# Patient Record
Sex: Male | Born: 1965 | Race: Black or African American | Hispanic: No | Marital: Married | Smoking: Never smoker
Health system: Southern US, Community
[De-identification: ages and names within clinical notes are randomized; demographics above are authoritative.]

## PROBLEM LIST (undated history)

## (undated) ENCOUNTER — Ambulatory Visit: Payer: Self-pay

---

## 1998-06-17 ENCOUNTER — Ambulatory Visit (HOSPITAL_COMMUNITY): Admission: RE | Admit: 1998-06-17 | Discharge: 1998-06-17 | Payer: Self-pay | Admitting: Gastroenterology

## 1998-11-19 ENCOUNTER — Emergency Department (HOSPITAL_COMMUNITY): Admission: EM | Admit: 1998-11-19 | Discharge: 1998-11-19 | Payer: Self-pay | Admitting: Emergency Medicine

## 2001-10-14 ENCOUNTER — Encounter: Payer: Self-pay | Admitting: Emergency Medicine

## 2001-10-14 ENCOUNTER — Emergency Department (HOSPITAL_COMMUNITY): Admission: EM | Admit: 2001-10-14 | Discharge: 2001-10-14 | Payer: Self-pay | Admitting: Emergency Medicine

## 2001-10-17 ENCOUNTER — Ambulatory Visit (HOSPITAL_COMMUNITY): Admission: RE | Admit: 2001-10-17 | Discharge: 2001-10-17 | Payer: Self-pay | Admitting: *Deleted

## 2010-01-27 ENCOUNTER — Emergency Department (HOSPITAL_COMMUNITY): Admission: EM | Admit: 2010-01-27 | Discharge: 2010-01-27 | Payer: Self-pay | Admitting: Emergency Medicine

## 2010-08-09 IMAGING — CT CT ANGIO NECK
2 of 10 series · 10 of 46 positions shown, 15 images · IV contrast (APPLIED)
Comparison: None.

CLINICAL DATA: Neck pain on the left.  Evaluate for possible
carotid dissection.

CT ANGIOGRAPHY NECK
TECHNIQUE: Multidetector CT imaging of the neck was performed
using the standard protocol during bolus administration of
intravenous contrast.  Multiplanar CT image reconstructions
including MIPs were obtained to evaluate the vascular anatomy.
Carotid stenosis measurements (when applicable) are obtained
utilizing NASCET criteria, using the distal internal carotid
diameter as the denominator.
Contrast:  Omnipaque 350, 100 ml

[Series 4: neck w/o 3.0 b40s · axial · non-contrast · 0.39mm/px · z∈[-210,-48]mm · 4 of 90 slices shown, 9 images]
[im 18/90  soft-tissue]
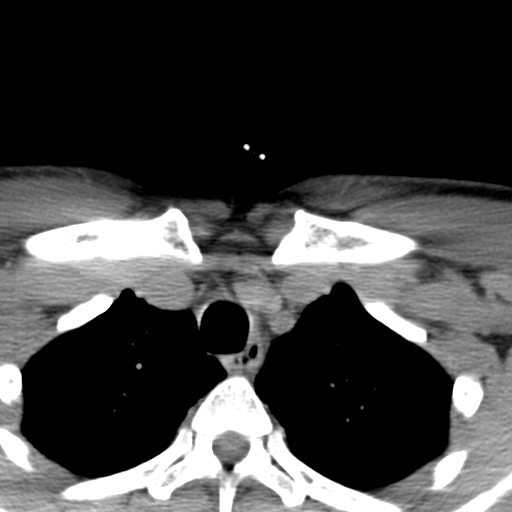
[im 18/90  lung]
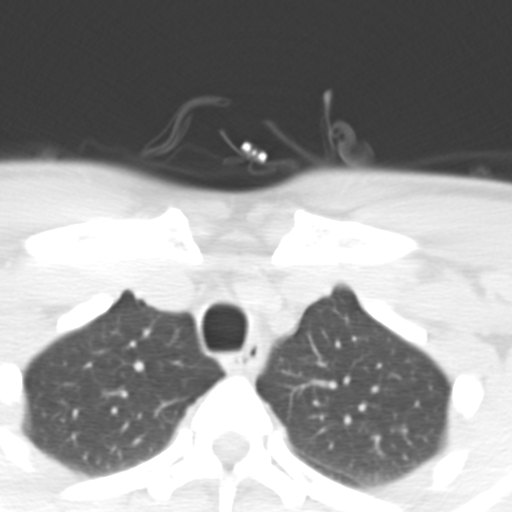
[im 18/90  bone]
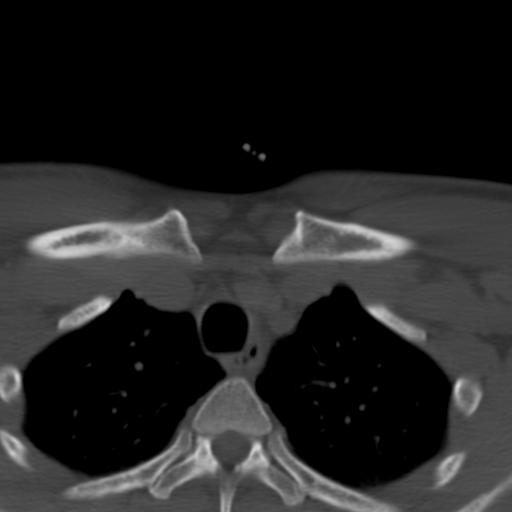
[im 36/90  soft-tissue]
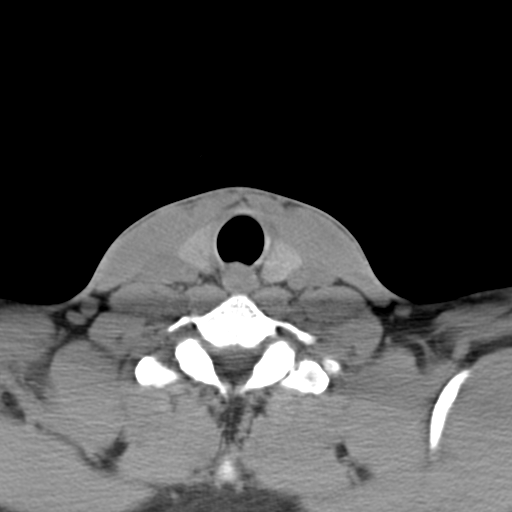
[im 36/90  lung]
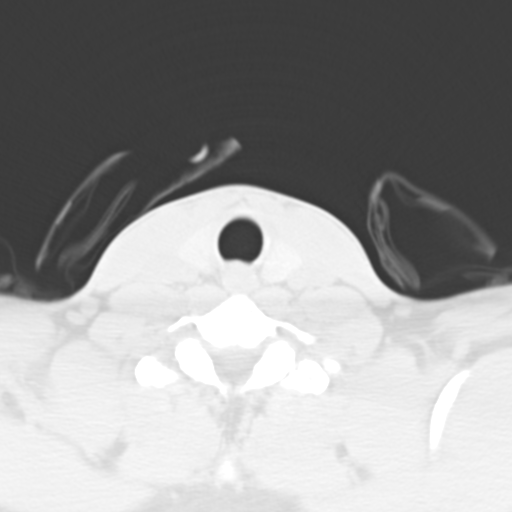
[im 54/90  soft-tissue]
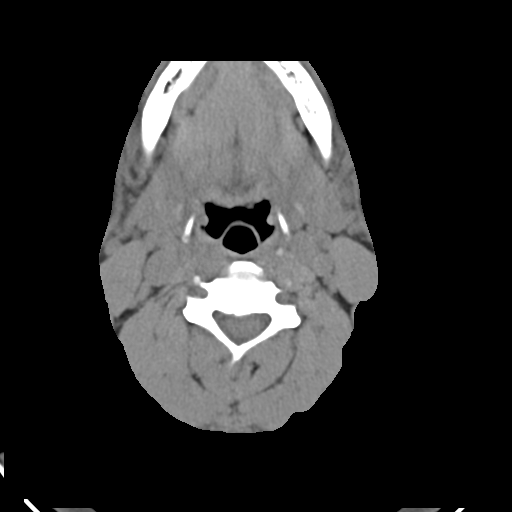
[im 54/90  lung]
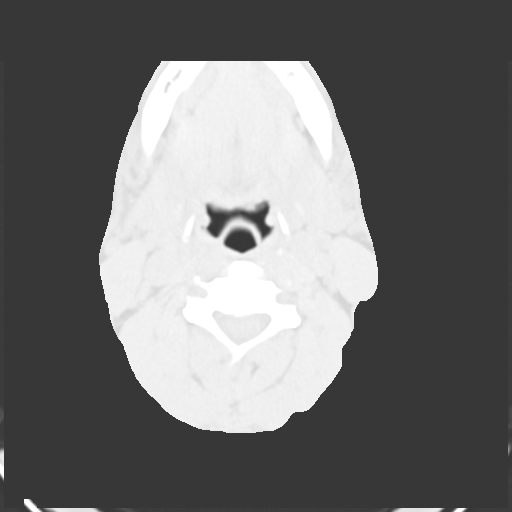
[im 72/90  soft-tissue]
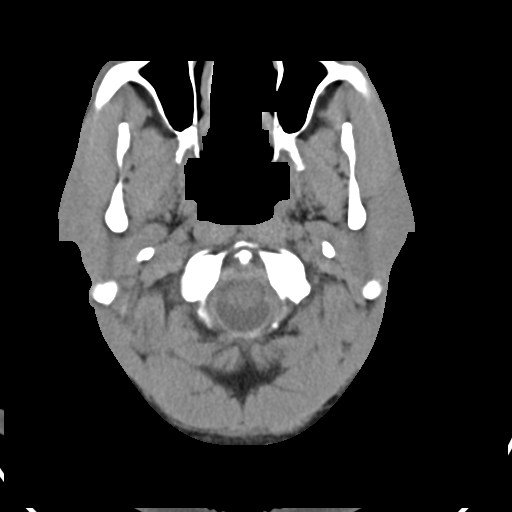
[im 72/90  lung]
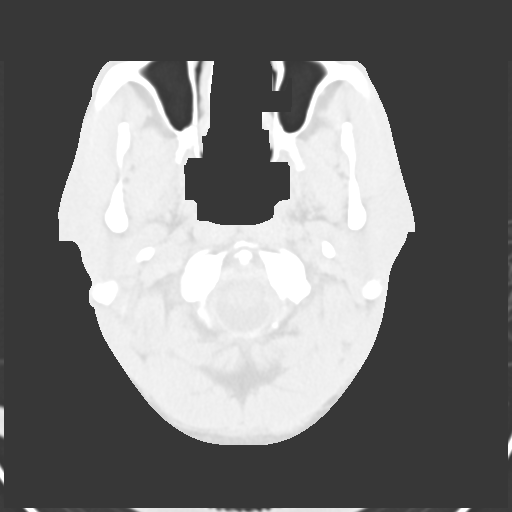

[Series 7: (person_name) 2.0 b30f · axial · 0.39mm/px · z∈[-225,-39]mm · 6 of 131 slices shown]
[im 19/131  soft-tissue]
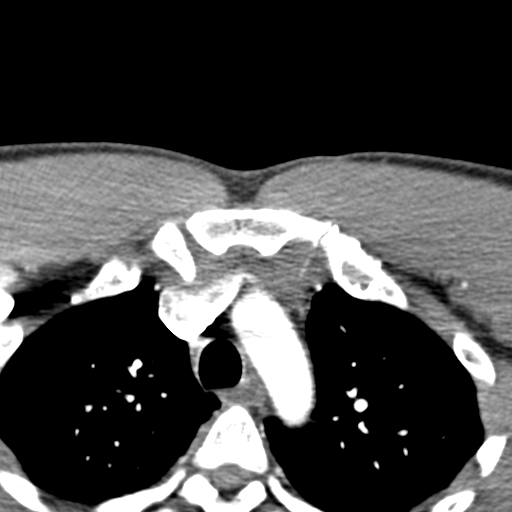
[im 38/131  soft-tissue]
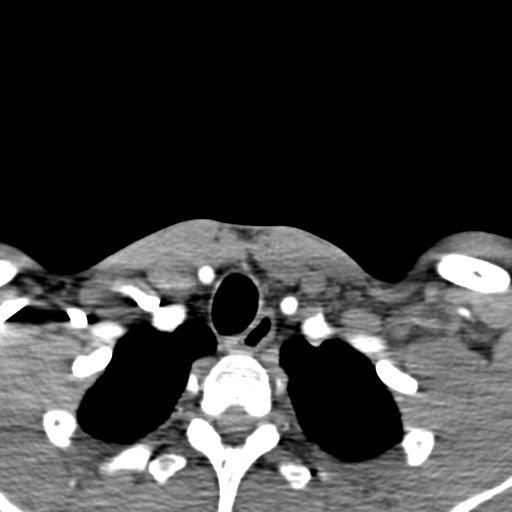
[im 56/131  soft-tissue]
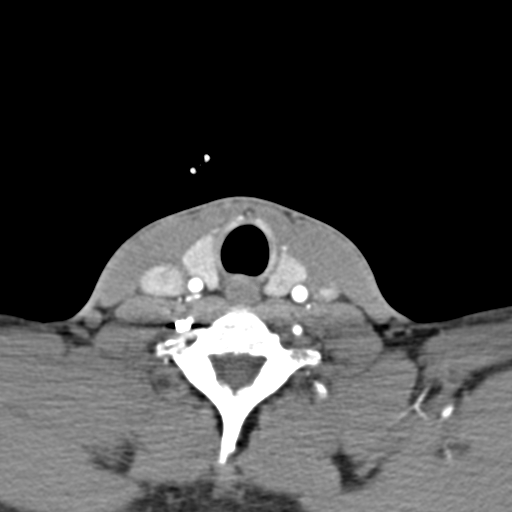
[im 75/131  soft-tissue]
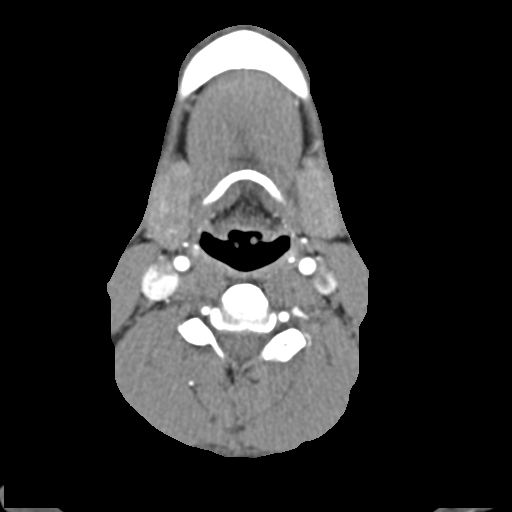
[im 93/131  soft-tissue]
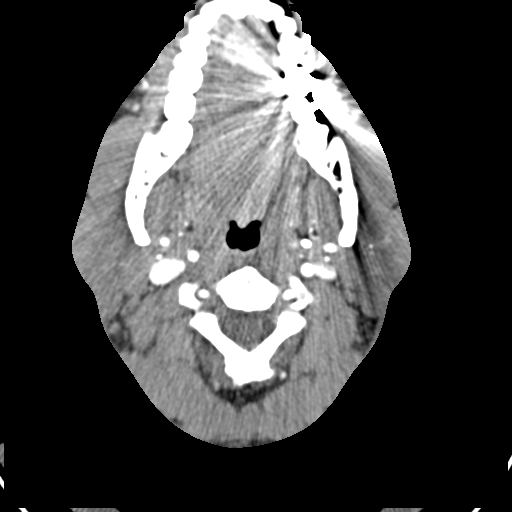
[im 112/131  soft-tissue]
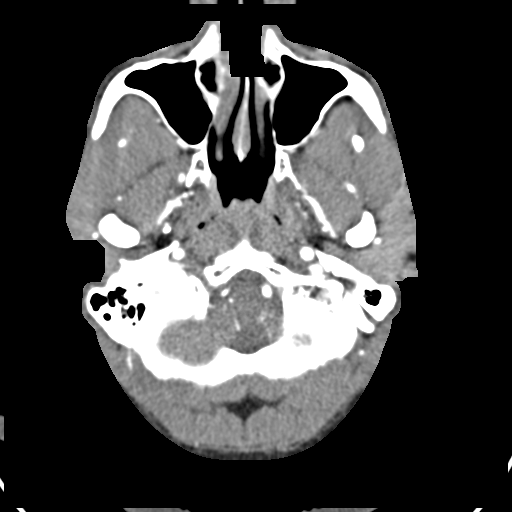

[10 of 46 positions shown; findings below may reference images not displayed]

FINDINGS: There is good opacification of the craniocerebral
vasculature.  Conventional branching of the great vessels from the
arch is seen.  There is no proximal stenosis.

Carotid bifurcations are free of disease.  There is no evidence for
carotid dissection or fibromuscular dysplasia.  Left greater than
right vertebral arteries are widely patent and contribute to the
basilar.

Mild cervical spondylosis is present.  No neck masses are seen.
Hypertrophied styloid process on the left incidentally noted.
Airway is clear.  Lung apices show no abnormality.

 Review of the MIP images confirms the above findings.
IMPRESSION: No evidence for carotid dissection or other acute vascular
abnormality.  Normal carotid bifurcation and cervical internal
carotid arteries.

## 2013-12-15 ENCOUNTER — Encounter (HOSPITAL_COMMUNITY): Payer: Self-pay | Admitting: Emergency Medicine

## 2013-12-15 ENCOUNTER — Emergency Department (HOSPITAL_COMMUNITY)
Admission: EM | Admit: 2013-12-15 | Discharge: 2013-12-15 | Disposition: A | Discharge status: Home / Self Care | Payer: BC Managed Care – PPO | Attending: Emergency Medicine | Admitting: Emergency Medicine

## 2013-12-15 DIAGNOSIS — R6883 Chills (without fever): Secondary | ICD-10-CM | POA: Insufficient documentation

## 2013-12-15 DIAGNOSIS — R197 Diarrhea, unspecified: Secondary | ICD-10-CM | POA: Insufficient documentation

## 2013-12-15 DIAGNOSIS — R112 Nausea with vomiting, unspecified: Secondary | ICD-10-CM | POA: Insufficient documentation

## 2013-12-15 DIAGNOSIS — R109 Unspecified abdominal pain: Secondary | ICD-10-CM | POA: Insufficient documentation

## 2013-12-15 DIAGNOSIS — R1110 Vomiting, unspecified: Secondary | ICD-10-CM

## 2013-12-15 LAB — COMPREHENSIVE METABOLIC PANEL
ALT: 27 U/L (ref 0–53)
AST: 18 U/L (ref 0–37)
Alkaline Phosphatase: 69 U/L (ref 39–117)
CO2: 26 mEq/L (ref 19–32)
Chloride: 101 mEq/L (ref 96–112)
Creatinine, Ser: 1.24 mg/dL (ref 0.50–1.35)
GFR calc non Af Amer: 68 mL/min — ABNORMAL LOW (ref >90)
Total Bilirubin: 0.6 mg/dL (ref 0.3–1.2)
Total Protein: 8 g/dL (ref 6.0–8.3)

## 2013-12-15 LAB — CBC WITH DIFFERENTIAL/PLATELET
Basophils Absolute: 0 10*3/uL (ref 0.0–0.1)
Basophils Relative: 0 % (ref 0–1)
Eosinophils Absolute: 0 10*3/uL (ref 0.0–0.7)
Eosinophils Relative: 0 % (ref 0–5)
HCT: 42.4 % (ref 39.0–52.0)
Hemoglobin: 15.3 g/dL (ref 13.0–17.0)
Lymphocytes Relative: 14 % (ref 12–46)
Lymphs Abs: 1.5 10*3/uL (ref 0.7–4.0)
MCH: 31.2 pg (ref 26.0–34.0)
MCHC: 36.1 g/dL — ABNORMAL HIGH (ref 30.0–36.0)
MCV: 86.5 fL (ref 78.0–100.0)
Monocytes Absolute: 0.7 10*3/uL (ref 0.1–1.0)
Monocytes Relative: 7 % (ref 3–12)
Neutro Abs: 8.2 10*3/uL — ABNORMAL HIGH (ref 1.7–7.7)
Neutrophils Relative %: 78 % — ABNORMAL HIGH (ref 43–77)
Platelets: 169 10*3/uL (ref 150–400)
RBC: 4.9 MIL/uL (ref 4.22–5.81)
RDW: 13.4 % (ref 11.5–15.5)
WBC: 10.4 10*3/uL (ref 4.0–10.5)

## 2013-12-15 MED ORDER — SODIUM CHLORIDE 0.9 % IV BOLUS (SEPSIS)
1000.0000 mL | Freq: Once | INTRAVENOUS | Status: AC
  Administered 2013-12-15: 1000 mL via INTRAVENOUS

## 2013-12-15 MED ORDER — PROMETHAZINE HCL 25 MG/ML IJ SOLN
12.5000 mg | Freq: Once | INTRAMUSCULAR | Status: AC
  Administered 2013-12-15: 12.5 mg via INTRAVENOUS
  Filled 2013-12-15: qty 1

## 2013-12-15 MED ORDER — ONDANSETRON HCL 4 MG/2ML IJ SOLN
4.0000 mg | Freq: Once | INTRAMUSCULAR | Status: AC
  Administered 2013-12-15: 4 mg via INTRAVENOUS
  Filled 2013-12-15: qty 2

## 2013-12-15 MED ORDER — ONDANSETRON HCL 4 MG PO TABS: 4.0000 mg | ORAL_TABLET | Freq: Four times a day (QID) | ORAL | Status: AC

## 2013-12-15 NOTE — ED Notes (Signed)
Pt states last night he started getting sick  Pt states last night he had diarrhea multiple times  This morning he woke up nauseated and has had vomiting throughout the day  Pt states he has drank ginger ale and sprite and is unable to keep anything down  Pt states the last time he vomited it was dark burgundy in color

## 2013-12-15 NOTE — ED Provider Notes (Signed)
CSN: [id number]     Arrival date & time 12/15/13  1911 History   First MD Initiated Contact with Patient 12/15/13 2039     Chief Complaint  Patient presents with  . Emesis   (Consider location/radiation/quality/duration/timing/severity/associated sxs/prior Treatment) HPI  This is a 47 year old male with no significant past medical history. He presents with vomiting and diarrhea. Onset of symptoms last night. He reports chills without objective fevers. He has had known sick contacts. Patient denies any bilious emesis but does state that he thought his vomit appeared darker burgundy after he vomited clot. He denies any part of blood. He endorses crampy gastric abdominal pain. Currently 2 /10.  It is nonradiating it is worse with vomiting.  History reviewed. No pertinent past medical history. History reviewed. No pertinent past surgical history. Family History  Problem Relation Age of Onset  . Hyperlipidemia Other   . Hypertension Other   . Diabetes Other   . CAD Other    History  Substance Use Topics  . Smoking status: Never Smoker   . Smokeless tobacco: Not on file  . Alcohol Use: No    Review of Systems  Constitutional: Negative.  Negative for fever.  Respiratory: Negative.  Negative for chest tightness and shortness of breath.   Cardiovascular: Negative.  Negative for chest pain.  Gastrointestinal: Positive for nausea, vomiting and abdominal pain. Negative for blood in stool.  Genitourinary: Negative.  Negative for dysuria and decreased urine volume.  Musculoskeletal: Negative for back pain.  Skin: Negative for rash.  Neurological: Negative for headaches.  All other systems reviewed and are negative.    Allergies  Review of patient's allergies indicates no known allergies.  Home Medications   Current Outpatient Rx  Name  Route  Sig  Dispense  Refill  . aspirin 325 MG tablet   Oral   Take 650 mg by mouth every 4 (four) hours as needed (pain).         . Multiple  Vitamins-Minerals (AIRBORNE) CHEW   Oral   Chew 1 tablet by mouth daily.         . ondansetron (ZOFRAN) 4 MG tablet   Oral   Take 1 tablet (4 mg total) by mouth every 6 (six) hours.   12 tablet   0    BP 114/52  Pulse 76  Temp(Src) 99.4 F (37.4 C) (Oral)  Resp 14  SpO2 98% Physical Exam  Nursing note and vitals reviewed. Constitutional: He is oriented to person, place, and time. No distress.  Ill-appearing but nontoxic  HENT:  Head: Normocephalic and atraumatic.  Mucous membranes dry  Eyes: Pupils are equal, round, and reactive to light.  Neck: Neck supple.  Cardiovascular: Normal rate, regular rhythm and normal heart sounds.   No murmur heard. Pulmonary/Chest: Effort normal and breath sounds normal. No respiratory distress. He has no wheezes.  Abdominal: Soft. Bowel sounds are normal. He exhibits no distension. There is no tenderness. There is no rebound.  Musculoskeletal: He exhibits no edema.  Lymphadenopathy:    He has no cervical adenopathy.  Neurological: He is alert and oriented to person, place, and time.  Skin: Skin is warm and dry.  Psychiatric: He has a normal mood and affect.    ED Course  Procedures (including critical care time) Labs Review Labs Reviewed  CBC WITH DIFFERENTIAL - Abnormal; Notable for the following:    MCHC 36.1 (*)    Neutrophils Relative % 78 (*)    Neutro Abs 8.2 (*)  All other components within normal limits  COMPREHENSIVE METABOLIC PANEL - Abnormal; Notable for the following:    Potassium 3.3 (*)    Glucose, Bld 105 (*)    GFR calc non Af Amer 68 (*)    GFR calc Af Amer 79 (*)    All other components within normal limits   Imaging Review No results found.  EKG Interpretation   None       MDM   1. Vomiting and diarrhea    Patient presents with vomiting and diarrhea. He is nontoxic-appearing on exam. He does appear somewhat dehydrated. Patient was given normal saline bolus and Zofran. He was also given Phenergan.  Basic labwork is reassuring and he has no evidence of anemia.  Patient has no further vomiting in the ED. Exam is reassuring. The patient symptoms are most consistent with a viral gastroenteritis. He has no evidence of significant bleeding and feel that if he had any blood in his vomit he may have been secondary to irritation.  Potassium was noted to be 3.3. Have encouraged patient to drink electrolyte rich fluids. He was able to tolerate by mouth prior to discharge.  After history, exam, and medical workup I feel the patient has been appropriately medically screened and is safe for discharge home. Pertinent diagnoses were discussed with the patient. Patient was given return precautions.     Shon Baton, MD 12/15/13 [phone]

## 2013-12-15 NOTE — ED Notes (Signed)
Dr. Horton at bedside. 

## 2013-12-15 NOTE — ED Notes (Signed)
PO challenge initiated per MD order, pt given ginger ale

## 2020-10-12 ENCOUNTER — Other Ambulatory Visit: Payer: Self-pay

## 2020-10-12 DIAGNOSIS — Z20822 Contact with and (suspected) exposure to covid-19: Secondary | ICD-10-CM

## 2020-10-13 LAB — SARS-COV-2, NAA 2 DAY TAT

## 2020-10-13 LAB — NOVEL CORONAVIRUS, NAA: SARS-CoV-2, NAA: NOT DETECTED

## 2022-03-15 ENCOUNTER — Ambulatory Visit
Admission: EM | Admit: 2022-03-15 | Discharge: 2022-03-15 | Disposition: A | Discharge status: Home / Self Care | Payer: Self-pay

## 2022-03-15 ENCOUNTER — Encounter: Payer: Self-pay | Admitting: Emergency Medicine

## 2022-03-15 ENCOUNTER — Other Ambulatory Visit: Payer: Self-pay

## 2022-03-15 MED ORDER — BENZONATATE 100 MG PO CAPS: 100.0000 mg | ORAL_CAPSULE | Freq: Three times a day (TID) | ORAL | 0 refills | Status: DC | PRN

## 2022-03-15 MED ORDER — FLUTICASONE PROPIONATE 50 MCG/ACT NA SUSP: 1.0000 | Freq: Every day | NASAL | 0 refills | Status: DC

## 2022-03-15 MED ORDER — CETIRIZINE HCL 10 MG PO TABS: 10.0000 mg | ORAL_TABLET | Freq: Every day | ORAL | 0 refills | Status: DC

## 2022-03-15 NOTE — ED Triage Notes (Signed)
Patient c/o non-productive cough, head pressure, nasal drainage x 3 days.  Patient has taken Sudafed. ?

## 2022-03-15 NOTE — ED Notes (Signed)
Patient declined covid test, notified Ervin Knack, NP.  Test canceled in system ?

## 2022-03-15 NOTE — ED Provider Notes (Signed)
?[facility] ? ? ? ?CSN: [id number] ?Arrival date & time: 03/15/22  1035 ? ? ?  ? ?History   ?Chief Complaint ?Chief Complaint  ?Patient presents with  ? Cough  ? ? ?HPI ?DEAIRE MCWHIRTER is a 56 y.o. male.  ? ?Patient presents with cough, sinus pressure, nasal congestion and drainage that started approximately 3 days ago.  Patient has taken Sudafed for symptoms with minimal improvement.  Denies any known fevers.  Child has similar symptoms.  Denies chest pain, shortness of breath, sore throat, ear pain, nausea, vomiting, diarrhea, abdominal pain.  Patient denies history of asthma or COPD and patient is not a smoker. ? ? ?Cough ? ?History reviewed. No pertinent past medical history. ? ?There are no problems to display for this patient. ? ? ?History reviewed. No pertinent surgical history. ? ? ? ? ?Home Medications   ? ?Prior to Admission medications   ?Medication Sig Start Date End Date Taking? Authorizing Provider  ?benzonatate (TESSALON) 100 MG capsule Take 1 capsule (100 mg total) by mouth every 8 (eight) hours as needed for cough. 03/15/22  Yes Gustavus Bryant, FNP  ?cetirizine (ZYRTEC) 10 MG tablet Take 1 tablet (10 mg total) by mouth daily for 10 days. 03/15/22 03/25/22 Yes Gustavus Bryant, FNP  ?fluticasone (FLONASE) 50 MCG/ACT nasal spray Place 1 spray into both nostrils daily for 3 days. 03/15/22 03/18/22 Yes Gustavus Bryant, FNP  ?Multiple Vitamins-Minerals (CENTRUM PO) Take by mouth.   Yes [provider]  ?aspirin 325 MG tablet Take 650 mg by mouth every 4 (four) hours as needed (pain).    [provider]  ?Multiple Vitamins-Minerals (AIRBORNE) CHEW Chew 1 tablet by mouth daily.    [provider]  ?ondansetron (ZOFRAN) 4 MG tablet Take 1 tablet (4 mg total) by mouth every 6 (six) hours. 12/15/13   Shon Baton, MD  ? ? ?Family History ?Family History  ?Problem Relation Age of Onset  ? Hyperlipidemia Other   ? Hypertension Other   ? Diabetes Other   ? CAD Other    ? ? ?Social History ?Social History  ? ?Tobacco Use  ? Smoking status: Never  ?Substance Use Topics  ? Alcohol use: No  ? Drug use: No  ? ? ? ?Allergies   ?Patient has no known allergies. ? ? ?Review of Systems ?Review of Systems ?Per HPI ? ?Physical Exam ?Triage Vital Signs ?ED Triage Vitals  ?Enc Vitals Group  ?   BP 03/15/22 1133 140/85  ?   Pulse Rate 03/15/22 1133 82  ?   Resp 03/15/22 1133 18  ?   Temp 03/15/22 1133 99.2 ?F (37.3 ?C)  ?   Temp Source 03/15/22 1133 Oral  ?   SpO2 03/15/22 1133 97 %  ?   Weight 03/15/22 1134 160 lb (72.6 kg)  ?   Height 03/15/22 1134 5\' 6"  (1.676 m)  ?   Head Circumference --   ?   Peak Flow --   ?   Pain Score 03/15/22 1134 4  ?   Pain Loc --   ?   Pain Edu? --   ?   Excl. in GC? --   ? ?No data found. ? ?Updated Vital Signs ?BP 140/85 (BP Location: Left Arm)   Pulse 82   Temp 99.2 ?F (37.3 ?C) (Oral)   Resp 18   Ht 5\' 6"  (1.676 m)   Wt 160 lb (72.6 kg)   SpO2 97%  BMI 25.82 kg/m?  ? ?Visual Acuity ?Right Eye Distance:   ?Left Eye Distance:   ?Bilateral Distance:   ? ?Right Eye Near:   ?Left Eye Near:    ?Bilateral Near:    ? ?Physical Exam ?Constitutional:   ?   General: He is not in acute distress. ?   Appearance: Normal appearance. He is not toxic-appearing or diaphoretic.  ?HENT:  ?   Head: Normocephalic and atraumatic.  ?   Right Ear: Tympanic membrane and ear canal normal.  ?   Left Ear: Tympanic membrane and ear canal normal.  ?   Nose: Congestion present.  ?   Mouth/Throat:  ?   Mouth: Mucous membranes are moist.  ?   Pharynx: No posterior oropharyngeal erythema.  ?Eyes:  ?   Extraocular Movements: Extraocular movements intact.  ?   Conjunctiva/sclera: Conjunctivae normal.  ?   Pupils: Pupils are equal, round, and reactive to light.  ?Cardiovascular:  ?   Rate and Rhythm: Normal rate and regular rhythm.  ?   Pulses: Normal pulses.  ?   Heart sounds: Normal heart sounds.  ?Pulmonary:  ?   Effort: Pulmonary effort is normal. No respiratory distress.  ?   Breath  sounds: Normal breath sounds. No stridor. No wheezing, rhonchi or rales.  ?Abdominal:  ?   General: Abdomen is flat. Bowel sounds are normal.  ?   Palpations: Abdomen is soft.  ?Musculoskeletal:     ?   General: Normal range of motion.  ?   Cervical back: Normal range of motion.  ?Skin: ?   General: Skin is warm and dry.  ?Neurological:  ?   General: No focal deficit present.  ?   Mental Status: He is alert and oriented to person, place, and time. Mental status is at baseline.  ?Psychiatric:     ?   Mood and Affect: Mood normal.     ?   Behavior: Behavior normal.  ? ? ? ?UC Treatments / Results  ?Labs ?(all labs ordered are listed, but only abnormal results are displayed) ?Labs Reviewed  ?COVID-19, FLU A+B NAA  ? ? ?EKG ? ? ?Radiology ?No results found. ? ?Procedures ?Procedures (including critical care time) ? ?Medications Ordered in UC ?Medications - No data to display ? ?Initial Impression / Assessment and Plan / UC Course  ?I have reviewed the triage vital signs and the nursing notes. ? ?Pertinent labs & imaging results that were available during my care of the patient were reviewed by me and considered in my medical decision making (see chart for details). ? ?  ? ?Patient presents with symptoms likely from a viral upper respiratory infection. Differential includes bacterial pneumonia, sinusitis, allergic rhinitis, COVID-19, flu. Do not suspect underlying cardiopulmonary process. Symptoms seem unlikely related to ACS, CHF or COPD exacerbations, pneumonia, pneumothorax. Patient is nontoxic appearing and not in need of emergent medical intervention.  Suggested viral testing for COVID and flu but patient declined. ? ?Recommended symptom control with over the counter medications: Daily oral anti-histamine, Oral decongestant or IN corticosteroid, saline irrigations, cepacol lozenges, Robitussin, Delsym, honey tea.  Sent prescriptions. ? ?Return if symptoms fail to improve in 1-2 weeks or you develop shortness of  breath, chest pain, severe headache. Patient states understanding and is agreeable. ? ?Discharged with PCP followup.  ?Final Clinical Impressions(s) / UC Diagnoses  ? ?Final diagnoses:  ?Viral upper respiratory tract infection with cough  ? ? ? ?Discharge Instructions   ? ?  ?It appears  that you have a viral upper respiratory infection that should self resolve in the next few days with symptomatic treatment.  You have been prescribed 3 medications to help alleviate symptoms.  Follow-up if symptoms persist or worsen. ? ? ? ?ED Prescriptions   ? ? Medication Sig Dispense Auth. Provider  ? cetirizine (ZYRTEC) 10 MG tablet Take 1 tablet (10 mg total) by mouth daily for 10 days. 30 tablet Gustavus BryantMound, Syvilla Martin E, [place]  ? fluticasone (FLONASE) 50 MCG/ACT nasal spray Place 1 spray into both nostrils daily for 3 days. 16 g Ervin KnackMound, Mylo Choi E, [place]  ? benzonatate (TESSALON) 100 MG capsule Take 1 capsule (100 mg total) by mouth every 8 (eight) hours as needed for cough. 21 capsule Ervin KnackMound, Aiyonna Lucado E, [place]  ? ?  ? ?PDMP not reviewed this encounter. ?  ?Gustavus BryantMound, Kaysea Raya E, [place]NP ?03/15/22 1204 ? ?

## 2022-03-15 NOTE — Discharge Instructions (Signed)
It appears that you have a viral upper respiratory infection that should self resolve in the next few days with symptomatic treatment.  You have been prescribed 3 medications to help alleviate symptoms.  Follow-up if symptoms persist or worsen. ?

## 2024-09-08 ENCOUNTER — Encounter: Payer: Self-pay | Admitting: Emergency Medicine

## 2024-09-08 ENCOUNTER — Ambulatory Visit
Admission: EM | Admit: 2024-09-08 | Discharge: 2024-09-08 | Disposition: A | Discharge status: Home / Self Care | Payer: Self-pay | Attending: Emergency Medicine | Admitting: Emergency Medicine

## 2024-09-08 DIAGNOSIS — J111 Influenza due to unidentified influenza virus with other respiratory manifestations: Secondary | ICD-10-CM | POA: Diagnosis not present

## 2024-09-08 DIAGNOSIS — J069 Acute upper respiratory infection, unspecified: Secondary | ICD-10-CM

## 2024-09-08 LAB — POC SOFIA SARS ANTIGEN FIA: SARS Coronavirus 2 Ag: NEGATIVE

## 2024-09-08 MED ORDER — FLUTICASONE PROPIONATE 50 MCG/ACT NA SUSP: 1.0000 | Freq: Every day | NASAL | 0 refills | Status: AC

## 2024-09-08 MED ORDER — BENZONATATE 100 MG PO CAPS: 100.0000 mg | ORAL_CAPSULE | Freq: Three times a day (TID) | ORAL | 0 refills | Status: AC | PRN

## 2024-09-08 MED ORDER — CETIRIZINE HCL 10 MG PO TABS: 10.0000 mg | ORAL_TABLET | Freq: Every day | ORAL | 0 refills | Status: AC

## 2024-09-08 NOTE — ED Triage Notes (Signed)
 Pt states Friday he began to feel bad. The next day had fatigued, cough, body aches.

## 2024-09-08 NOTE — ED Provider Notes (Signed)
 [facility]    CSN: [id number] Arrival date & time: 09/08/24  1045      History   Chief Complaint No chief complaint on file.   HPI Alejandro Chaney is a 58 y.o. male.   58 year old male pt, Alejandro Chaney, presents to urgent care for evaluation of fatigue,cough,body aches x 4 days, states family has had similar illness, tried otc meds without relief. Pt denies smoking,drinking or drug use.   The history is provided by the patient. No language interpreter was used.    History reviewed. No pertinent past medical history.  Patient Active Problem List   Diagnosis Date Noted   Influenza-like illness 09/08/2024   Viral URI with cough 09/08/2024    History reviewed. No pertinent surgical history.     Home Medications    Prior to Admission medications   Medication Sig Start Date End Date Taking? Authorizing Provider  aspirin 325 MG tablet Take 650 mg by mouth every 4 (four) hours as needed (pain).    [provider]  benzonatate  (TESSALON ) 100 MG capsule Take 1 capsule (100 mg total) by mouth 3 (three) times daily as needed for cough. 09/08/24   Obryan Radu, NP  cetirizine  (ZYRTEC ) 10 MG tablet Take 1 tablet (10 mg total) by mouth daily for 10 days. 09/08/24 09/18/24  Carrah Eppolito, NP  fluticasone  (FLONASE ) 50 MCG/ACT nasal spray Place 1 spray into both nostrils daily for 3 days. 09/08/24 09/11/24  Tatsuo Musial, Rilla, NP  Multiple Vitamins-Minerals (AIRBORNE) CHEW Chew 1 tablet by mouth daily.    [provider]  Multiple Vitamins-Minerals (CENTRUM PO) Take by mouth.    [provider]  ondansetron  (ZOFRAN ) 4 MG tablet Take 1 tablet (4 mg total) by mouth every 6 (six) hours. 12/15/13   Horton, Charmaine FALCON, MD    Family History Family History  Problem Relation Age of Onset   Hyperlipidemia Other    Hypertension Other    Diabetes Other    CAD Other     Social History Social History   Tobacco Use   Smoking status: Never   Substance Use Topics   Alcohol use: No   Drug use: No     Allergies   Patient has no known allergies.   Review of Systems Review of Systems  Constitutional:  Positive for fatigue. Negative for fever.  Respiratory:  Positive for cough.   Musculoskeletal:  Positive for myalgias.  All other systems reviewed and are negative.    Physical Exam Triage Vital Signs ED Triage Vitals  Encounter Vitals Group     BP 09/08/24 1119 124/85     Girls Systolic BP Percentile --      Girls Diastolic BP Percentile --      Boys Systolic BP Percentile --      Boys Diastolic BP Percentile --      Pulse Rate 09/08/24 1119 80     Resp 09/08/24 1119 16     Temp 09/08/24 1119 98.2 F (36.8 C)     Temp Source 09/08/24 1119 Oral     SpO2 09/08/24 1119 98 %     Weight --      Height --      Head Circumference --      Peak Flow --      Pain Score 09/08/24 1121 3     Pain Loc --      Pain Education --      Exclude from Growth Chart --  No data found.  Updated Vital Signs BP 124/85 (BP Location: Right Arm)   Pulse 80   Temp 98.2 F (36.8 C) (Oral)   Resp 16   SpO2 98%   Visual Acuity Right Eye Distance:   Left Eye Distance:   Bilateral Distance:    Right Eye Near:   Left Eye Near:    Bilateral Near:     Physical Exam Vitals and nursing note reviewed.  Constitutional:      General: He is not in acute distress.    Appearance: He is well-developed. He is not ill-appearing or toxic-appearing.  HENT:     Head: Normocephalic.     Right Ear: Tympanic membrane is retracted.     Left Ear: Tympanic membrane is retracted.     Nose: Mucosal edema and congestion present.     Mouth/Throat:     Lips: Pink.     Mouth: Mucous membranes are moist.     Pharynx: Oropharynx is clear. Uvula midline.  Eyes:     General: Lids are normal.     Conjunctiva/sclera: Conjunctivae normal.     Pupils: Pupils are equal, round, and reactive to light.  Cardiovascular:     Rate and Rhythm: Normal  rate and regular rhythm.     Heart sounds: Normal heart sounds.  Pulmonary:     Effort: Pulmonary effort is normal. No respiratory distress.     Breath sounds: Normal breath sounds and air entry. No decreased breath sounds or wheezing.  Abdominal:     General: There is no distension.     Palpations: Abdomen is soft.  Musculoskeletal:        General: Normal range of motion.     Cervical back: Normal range of motion.  Skin:    General: Skin is warm and dry.     Findings: No rash.  Neurological:     General: No focal deficit present.     Mental Status: He is alert and oriented to person, place, and time.     GCS: GCS eye subscore is 4. GCS verbal subscore is 5. GCS motor subscore is 6.     Cranial Nerves: No cranial nerve deficit.     Sensory: No sensory deficit.  Psychiatric:        Attention and Perception: Attention normal.        Mood and Affect: Mood normal.        Speech: Speech normal.        Behavior: Behavior normal. Behavior is cooperative.      UC Treatments / Results  Labs (all labs ordered are listed, but only abnormal results are displayed) Labs Reviewed  POC SOFIA SARS ANTIGEN FIA    EKG   Radiology No results found.  Procedures Procedures (including critical care time)  Medications Ordered in UC Medications - No data to display  Initial Impression / Assessment and Plan / UC Course  I have reviewed the triage vital signs and the nursing notes.  Pertinent labs & imaging results that were available during my care of the patient were reviewed by me and considered in my medical decision making (see chart for details).    Discussed exam findings and plan of care with patient, tessalon ,zyrtec ,flonase  scripted, strict go to ER precautions given.   Patient verbalized understanding to this provider.  Ddx: Flu like illness, viral uri w cough,allergies Final Clinical Impressions(s) / UC Diagnoses   Final diagnoses:  Influenza-like illness  Viral URI with  cough  Discharge Instructions      Your covid test is negative. Most likely you have a viral illness: no antibiotic is indicated at this time, May treat with OTC meds of choice. Make sure to drink plenty of fluids to stay hydrated(gatorade, water, popsicles,jello,etc), avoid caffeine products. Follow up with PCP 3 days, sooner if worse.  If you develop chest pain, shortness of breath, palpitations, worsening symptoms, go to the emergency room for further evaluation.     ED Prescriptions     Medication Sig Dispense Auth. Provider   benzonatate  (TESSALON ) 100 MG capsule Take 1 capsule (100 mg total) by mouth 3 (three) times daily as needed for cough. 21 capsule Consetta Cosner, NP   cetirizine  (ZYRTEC ) 10 MG tablet Take 1 tablet (10 mg total) by mouth daily for 10 days. 10 tablet Anchor Dwan, NP   fluticasone  (FLONASE ) 50 MCG/ACT nasal spray Place 1 spray into both nostrils daily for 3 days. 16 g Abena Erdman, Rilla, NP      PDMP not reviewed this encounter.   Aminta Rilla, NP 09/08/24 [phone]

## 2024-09-08 NOTE — Discharge Instructions (Addendum)
 Your covid test is negative. Most likely you have a viral illness: no antibiotic is indicated at this time, May treat with OTC meds of choice. Make sure to drink plenty of fluids to stay hydrated(gatorade, water, popsicles,jello,etc), avoid caffeine products. Follow up with PCP 3 days, sooner if worse.  If you develop chest pain, shortness of breath, palpitations, worsening symptoms, go to the emergency room for further evaluation.
